# Patient Record
Sex: Female | Born: 1990 | Race: Black or African American | Hispanic: No | Marital: Single
Health system: Midwestern US, Academic
[De-identification: ages and names within clinical notes are randomized; demographics above are authoritative.]

## PROBLEM LIST (undated history)

## (undated) DIAGNOSIS — I10 Essential (primary) hypertension: Secondary | ICD-10-CM

## (undated) DIAGNOSIS — J45909 Unspecified asthma, uncomplicated: Secondary | ICD-10-CM

## (undated) DIAGNOSIS — G932 Benign intracranial hypertension: Secondary | ICD-10-CM

---

## 2014-07-06 DIAGNOSIS — R112 Nausea with vomiting, unspecified: Secondary | ICD-10-CM

## 2014-07-06 MED ORDER — sodium chloride 0.9 % 1,000 mL IV fluid
Freq: Once | INTRAVENOUS | Status: AC
Start: 2014-07-06 — End: 2014-07-07
  Administered 2014-07-07: 04:00:00 via INTRAVENOUS

## 2014-07-06 MED ORDER — ondansetron (ZOFRAN) 4 mg/2 mL injection 4 mg
4 | Freq: Once | INTRAMUSCULAR | Status: AC
Start: 2014-07-06 — End: 2014-07-06
  Administered 2014-07-07: 04:00:00 4 mg via INTRAVENOUS

## 2014-07-06 MED ORDER — ketorolac (TORADOL) injection 15 mg
15 | Freq: Once | INTRAMUSCULAR | Status: AC
Start: 2014-07-06 — End: 2014-07-06
  Administered 2014-07-07: 04:00:00 15 mg via INTRAVENOUS

## 2014-07-06 MED FILL — ONDANSETRON HCL (PF) 4 MG/2 ML INJECTION SOLUTION: 4 4 mg/2 mL | INTRAMUSCULAR | Qty: 2

## 2014-07-06 MED FILL — KETOROLAC 15 MG/ML INJECTION SOLUTION: 15 15 mg/mL | INTRAMUSCULAR | Qty: 1

## 2014-07-06 NOTE — Unmapped (Signed)
[facility] ED Note    07/06/2014    Patient History     HPI: Jennifer Hurst is a 23 y.o. female who presents with Pelvic Pain  .  Patient with diarrhea ongoing for the past few months she said she usually has 2 loose stools a day no black stools no blood from below.  Patient has had some nausea and episodic vomiting over the past 2 weeks as well she has some right flank pain suprapubic pain.  She complains of dysuria no hematuria.  No fevers or chills no body aches.  Patient denies pregnancy she is sexually active only with her and she is here with her partner.  Patient denies vaginal discharge vaginal itching or vaginal complaints.     History reviewed. No pertinent past medical history.    History reviewed. No pertinent past surgical history.     reports that she has been smoking Cigarettes.  She has been smoking about 0.50 packs per day. She does not have any smokeless tobacco history on file. She reports that she does not drink alcohol or use illicit drugs.    Discharge Medication List as of 07/07/2014  2:40 AM          Allergies:   Allergies as of 07/06/2014   ??? (No Known Allergies)       Review of Systems     ROS: See HPI for pertinent positives  All other ROS were negative.    Physical Exam     ED Triage Vitals   Vital Signs Group      Temp 07/06/14 2230 98.5 ??F (36.9 ??C)      Temp Source 07/06/14 2230 Oral      Heart Rate 07/06/14 2230 100      Heart Rate Source 07/06/14 2230 Automatic      Resp 07/06/14 2230 18      SpO2 07/06/14 2230 97 %      BP 07/06/14 2230 147/93 mmHg      BP Location 07/06/14 2230 Left arm      BP Method 07/06/14 2230 Automatic      Patient Position 07/06/14 2230 Sitting   SpO2 07/06/14 2230 97 %   O2 Device --      Filed Vitals:    07/06/14 2230 07/07/14 0017 07/07/14 0213   BP: 147/93 140/89 129/80   Pulse: 100 95 78   Temp: 98.5 ??F (36.9 ??C) 98.4 ??F (36.9 ??C) 98 ??F (36.7 ??C)   TempSrc: Oral Oral Oral   Resp: 18 16 16    Height: 5' 5  (1.651 m)     Weight: 215 lb (97.523 kg)     SpO2: 97% 97% 97%      Temp Readings from Last 2 Encounters:   07/07/14 98 ??F (36.7 ??C) Oral     BP Readings from Last 2 Encounters:   07/07/14 129/80     Pulse Readings from Last 2 Encounters:   07/07/14 78        Constitutional: Generally well appearing, ambulatory without difficulty, no acute distress, non-toxic appearance   Eyes:  Sclera anicteric, conjunctiva normal.  HEENT:  Atraumatic, external ears normal, oropharynx moist.   Neck: normal range of motion, supple.  Respiratory:  No respiratory distress, No excessory muscle use  Cardiovascular:  Regular rate, 2+ pulse = BL radial.  GI:  Soft, nondistended, no rebound, no guarding.  Minimal suprapubic tenderness, minimal right flank tenderness  Musculoskeletal:  No edema, no deformities.   Skin:  No rash or nodules noted.   Neurologic:  Awake and alert.  Moves all four extremities.  Light touch intact.    Psychiatric:  Normal mood.  Behavior appropriate.      Diagnostic Studies     Labs:      Labs Reviewed   CBC - Abnormal; Notable for the following:     Hemoglobin 11.4 (*)     MCH 26.7 (*)     All other components within normal limits   URINALYSIS-MACROSCOPIC W/REFLEX TO MICROSCOPIC - Abnormal; Notable for the following:     Protein, UA 30 mg/dL (*)     Ketones, UA Trace (*)     All other components within normal limits   URINALYSIS, MICROSCOPIC - Abnormal; Notable for the following:     Bacteria, UA Rare (*)     Mucus, UA Present (*)     All other components within normal limits   P24 W/REFLEX TESTED STUDY [facility] ED    Narrative:     HIV-1 p24 Antigen and HIV-1/HIV-2 Antibody not detected.   DIFFERENTIAL   HEPATIC FUNCTION PANEL   LIPASE   HCG URINE, QUALITATIVE   BASIC METABOLIC PANEL       Radiology:  None      EKG interpretation: None performed    Emergency Department Procedures         ED Course and MDM     Jennifer Hurst is a 23 y.o. female who presented to the emergency department with low abdominal pain for the  past month she says is similar to pain she is experienced with ovarian cysts in the past.  Several loose stools a day for the past few weeks although no blood no black stool no recent travel or antibiotics.  Tolerating by mouth without difficulty her lab testing is reassuring.  She has minimal suprapubic pain however no pelvic complaints whatsoever I did discuss at length with her the need to rule out pelvic inflammatory disease with pelvic exam she referred to defer this study at this time was to follow up closely with gynecology.  She is given standard precautions for potential PID including chronic pain, infertility.  Discharge home    Meds given in ED or prescribed for discharge:  Medications   sodium chloride 0.9 % 1,000 mL IV fluid ( Intravenous Stopped 07/07/14 0239)   ondansetron (ZOFRAN) 4 mg/2 mL injection 4 mg (4 mg Intravenous Given 07/06/14 2358)   ketorolac (TORADOL) injection 15 mg (15 mg Intravenous Given 07/06/14 2358)       Clinical Impression:  1. Nausea, vomiting and diarrhea    2. Lower abdominal pain        Patient Referred to:    pcp list      Call the number for the primary care doctor on your insurance card to establish with a primary care doctor as soon as possible for evaluation of your chronic gastointestinal symptoms.    No Pcp  [address]  [place] [address]            Discharge Medications:  Discharge Medication List as of 07/07/2014  2:40 AM      START taking these medications    Details   HYDROcodone-acetaminophen (NORCO) 5-325 mg per tablet Take 1 tablet by mouth every 6 hours as needed for Pain., Starting 07/07/2014, Until Discontinued, Print      ondansetron (ZOFRAN ODT) 4 MG disintegrating tablet Take 1 tablet (4 mg total) by mouth every 6 hours as  needed for Nausea., Starting 07/07/2014, Until Discontinued, Print                   Critical Care Time (Attendings)         Lashelle Koy, MD  07/07/14 [phone]

## 2014-07-06 NOTE — Unmapped (Signed)
Patient ambulated to triage with complaints of nausea, vomiting and pelvic pain for 2 weeks. Pt denies chance of pregnancy.

## 2014-07-06 NOTE — Unmapped (Signed)
Jennifer Hurst     Date of Service: 07/06/2014     Reason for Visit: Pelvic Pain       HPI: Patient is a 23 year old female with PMH of HTN who p/w diarrhea. Patient reports that in January she completed a course of abx for what she describes as food poisoning. Since then she has had two loose stools daily whereas before she had daily well formed bowel movements. She denies blood in stool but says that sometimes her stools are very dark in appearance. She says that one month ago she also began to have abdominal and flank pain. She says her pain is sharp but intermittent bilateral in all quadrants and migratory. She says the pain is usually worst in the suprapubic area and her bilateral flank. She developed nausea three days ago and has vomited once daily for the last three days. Vomit is NB/NB. Her last PO was one hour ago when she ate some chicken. She denies vag discharge/bleeding/discomfort. Her LMP was one week ago. She has regular menstrual cycles. She has no Hx of STI. She is sexually active with women only. She reports that what brought her in today was that she was vomiting and having diarrhea at the same time which she found concerning. She reports dysuria but denies hematuria. [name] in addition to her antibiotic course in January she has taken antibiotic two other times in the last 6 months. She is uncertain of the diagnosis associated with these antibiotic course. She was seen at [facility] in March for these symptoms and was prescribed abx for her GI pain. She also completed a course of abx one month ago for mosquito bites.        History reviewed. No pertinent past medical history.     History reviewed. No pertinent past surgical history.     Jennifer Hurst  reports that she has been smoking Cigarettes.  She has been smoking about 0.50 packs per day. She does not have any smokeless tobacco history on file. She reports that she does not drink alcohol or use illicit drugs.     Previous  Medications    No medications on file        Allergies:   Allergies as of 07/06/2014   ??? (No Known Allergies)        Review of Systems  Gen: Denies fever/chills.   Card: Denies CP, palpitations.   Pulm: Denies SOB, cough.   GI: See HPI  GU: See HPI  Ext: Denies pretibial edema.   Neuro: Denies focal limb weakness/numbness.   Psych: Denies depression/anxiety.      Physical Exam   ED Triage Vitals   Vital Signs Group      Temp 07/06/14 2230 98.5 ??F (36.9 ??C)      Temp Source 07/06/14 2230 Oral      Heart Rate 07/06/14 2230 100      Heart Rate Source 07/06/14 2230 Automatic      Resp 07/06/14 2230 18      SpO2 07/06/14 2230 97 %      BP 07/06/14 2230 147/93 mmHg      BP Location 07/06/14 2230 Left arm      BP Method 07/06/14 2230 Automatic      Patient Position 07/06/14 2230 Sitting   SpO2 07/06/14 2230 97 %   O2 Device --      Gen: Well appearing obese female lying in bed in NAD.  Head: Normocephalic, atraumatic.   Eyes: PERRL, EOMI.   Throat: Oropharynx clear, MMM.   Card: RRR, no M/R/G. No chest wall tenderness.   Pulm: CTAB, no wheezes, rales, or rhonchi.   GI: Soft, non distended. Suprapubic tenderness to palpation. Mild R CVA tenderness. No rebound, some voluntary guarding.  Ext: No pretibial edema.   Neuro: CN grossly intact. Moving all extremities, no focal limb weakness/numbness except as outlined previously in exam. Normal gait. No dysmetria or dysarthria.   Psych: Appropriate mood/affect.        Diagnostic Studies   Labs:   Please see electronic medical record for any tests performed in the ED     Radiology:  No tests were performed during this ED visit       ED Course and MDM     Jennifer Hurst is a 23 y.o. female  with PMH of HTN who p/w diarrhea. Given suprapubic pain and dysuria initial suspicion in this patient was highest for UTI or gastroenteritis given vomiting and diarrhea w/ relatively benign abdominal exam. Patient is well appearing and pyelonephritis seems unlikely despite mild CVA tenderness. For  similar reasons serious GI pathology seems unlikely. STI is unlikely as patient has no GU complaints except for dysuria. Patient declined pelvic exam for further evaluation of possible GU pathology. Patient received routine laboratory workup (LFT, CBC, UA, BMP, pregnancy) which was unremarkable except for trace ketones. Patient received 15mg  toradol for pain control as well as 4mg  Zofran and 1L NS for nausea. Given absence of evidence of UTI it is most likely that patient has gastroenteritis compounded by chronic GI disturbance. Standard discharge precautions were indicated to patient and patient has number for establishing care with PCP to further evaluate GI complaints.    Clinical Impression:  Gastroenteritis     Disposition/Plan: Home in stable condition.    The patient was discussed with my supervising physician who is in agreement with the assessment and plan   unless otherwise noted per their documentation.

## 2014-07-07 ENCOUNTER — Inpatient Hospital Stay
Admit: 2014-07-07 | Discharge: 2014-07-07 | Disposition: A | Discharge status: Home / Self Care | Payer: Medicaid (Managed Care)

## 2014-07-07 LAB — CBC
Hematocrit: 35.5 % (ref 35.0–45.0)
Hemoglobin: 11.4 g/dL (ref 11.7–15.5)
MCH: 26.7 pg (ref 27.0–33.0)
MCHC: 32.1 g/dL (ref 32.0–36.0)
MCV: 83.2 fL (ref 80.0–100.0)
MPV: 8.8 fL (ref 7.5–11.5)
Platelets: 274 10*3/uL (ref 140–400)
RBC: 4.27 10*6/uL (ref 3.80–5.10)
RDW: 13.4 % (ref 11.0–15.0)
WBC: 6.5 10*3/uL (ref 3.8–10.8)

## 2014-07-07 LAB — HEPATIC FUNCTION PANEL
ALT: 11 U/L (ref 7–52)
AST: 15 U/L (ref 13–39)
Albumin: 4.2 g/dL (ref 3.5–5.7)
Alkaline Phosphatase: 56 U/L (ref 36–125)
Bilirubin, Direct: 0 mg/dL (ref 0.0–0.4)
Bilirubin, Indirect: 0.2 mg/dL (ref 0.0–1.1)
Total Bilirubin: 0.2 mg/dL (ref 0.0–1.5)
Total Protein: 7.1 g/dL (ref 6.4–8.9)

## 2014-07-07 LAB — URINALYSIS-MACROSCOPIC W/REFLEX TO MICROSCOPIC
Bilirubin, UA: NEGATIVE
Blood, UA: NEGATIVE
Glucose, UA: NEGATIVE mg/dL
Leukocytes, UA: NEGATIVE
Nitrite, UA: NEGATIVE
Protein, UA: 30 mg/dL
Specific Gravity, UA: 1.025 (ref 1.005–1.035)
Urobilinogen, UA: 0.2 EU/dL (ref 0.2–1.0)
pH, UA: 7 (ref 5.0–8.0)

## 2014-07-07 LAB — URINALYSIS, MICROSCOPIC
RBC, UA: 1 /HPF (ref 0–3)
Squam Epithel, UA: <1 /HPF (ref 0–5)
WBC, UA: 1 /HPF (ref 0–5)

## 2014-07-07 LAB — BASIC METABOLIC PANEL
Anion Gap: 7 mmol/L (ref 3–16)
BUN: 11 mg/dL (ref 7–25)
CO2: 26 mmol/L (ref 21–33)
Calcium: 9.4 mg/dL (ref 8.6–10.3)
Chloride: 104 mmol/L (ref 98–110)
Creatinine: 0.85 mg/dL (ref 0.60–1.30)
GFR MDRD Af Amer: 101 See note.
GFR MDRD Non Af Amer: 84 See note.
Glucose: 85 mg/dL (ref 70–100)
Osmolality, Calculated: 283 mOsm/kg (ref 278–305)
Potassium: 3.8 mmol/L (ref 3.5–5.3)
Sodium: 137 mmol/L (ref 133–146)

## 2014-07-07 LAB — DIFFERENTIAL
Basophils Absolute: 20 /uL (ref 0–200)
Basophils Relative: 0.3 % (ref 0.0–1.0)
Eosinophils Absolute: 234 /uL (ref 15–500)
Eosinophils Relative: 3.6 % (ref 0.0–8.0)
Lymphocytes Absolute: 2028 /uL (ref 850–3900)
Lymphocytes Relative: 31.2 % (ref 15.0–45.0)
Monocytes Absolute: 559 /uL (ref 200–950)
Monocytes Relative: 8.6 % (ref 0.0–12.0)
Neutrophils Absolute: 3660 /uL (ref 1500–7800)
Neutrophils Relative: 56.3 % (ref 40.0–80.0)

## 2014-07-07 LAB — HCG URINE, QUALITATIVE: Preg Test, Ur: NEGATIVE

## 2014-07-07 LAB — LIPASE: Lipase: 19 U/L (ref 4–82)

## 2014-07-07 LAB — P24 W/REFLEX TESTED STUDY UCMC ED: HIV 1+2 AB/AGN: NONREACTIVE

## 2014-07-07 MED ORDER — HYDROcodone-acetaminophen (NORCO) 5-325 mg per tablet
5-325 | ORAL_TABLET | Freq: Four times a day (QID) | ORAL | 0.00 refills | 15.50000 days | Status: AC | PRN
Start: 2014-07-07 — End: ?

## 2014-07-07 MED ORDER — ondansetron (ZOFRAN ODT) 4 MG disintegrating tablet
4 | ORAL_TABLET | Freq: Four times a day (QID) | ORAL | Status: AC | PRN
Start: 2014-07-07 — End: ?

## 2014-07-07 NOTE — Unmapped (Signed)
Diarrhea  Diarrhea is frequent loose and watery bowel movements. It can cause you to feel weak and dehydrated. Dehydration can cause you to become tired and thirsty, have a dry mouth, and have decreased urination that often is dark yellow. Diarrhea is a sign of another problem, most often an infection that will not last long. In most cases, diarrhea typically lasts 2 3 days. However, it can last longer if it is a sign of something more serious. It is important to treat your diarrhea as directed by your caregive to lessen or prevent future episodes of diarrhea.  CAUSES   Some common causes include:  ?? Gastrointestinal infections caused by viruses, bacteria, or parasites.  ?? Food poisoning or food allergies.  ?? Certain medicines, such as antibiotics, chemotherapy, and laxatives.  ?? Artificial sweeteners and fructose.  ?? Digestive disorders.  HOME CARE INSTRUCTIONS  ?? Ensure adequate fluid intake (hydration): have 1 cup (8 oz) of fluid for each diarrhea episode. Avoid fluids that contain simple sugars or sports drinks, fruit juices, whole milk products, and sodas. Your urine should be clear or pale yellow if you are drinking enough fluids. Hydrate with an oral rehydration solution that you can purchase at pharmacies, retail stores, and online. You can prepare an oral rehydration solution at home by mixing the following ingredients together:  ??   tsp table salt.  ?? ?? tsp baking soda.  ??  tsp salt substitute containing potassium chloride.  ?? 1  tablespoons sugar.  ?? 1 L (34 oz) of water.  ?? Certain foods and beverages may increase the speed at which food moves through the gastrointestinal (GI) tract. These foods and beverages should be avoided and include:  ?? Caffeinated and alcoholic beverages.  ?? High-fiber foods, such as raw fruits and vegetables, nuts, seeds, and whole grain breads and cereals.  ?? Foods and beverages sweetened with sugar alcohols, such as xylitol, sorbitol, and mannitol.  ?? Some foods may be well  tolerated and may help thicken stool including:  ?? Starchy foods, such as rice, toast, pasta, low-sugar cereal, oatmeal, grits, baked potatoes, crackers, and bagels.  ?? Bananas.  ?? Applesauce.  ?? Add probiotic-rich foods to help increase healthy bacteria in the GI tract, such as yogurt and fermented milk products.  ?? Wash your hands well after each diarrhea episode.  ?? Only take over-the-counter or prescription medicines as directed by your caregiver.  ?? Take a warm bath to relieve any burning or pain from frequent diarrhea episodes.  SEEK IMMEDIATE MEDICAL CARE IF:   ?? You are unable to keep fluids down.  ?? You have persistent vomiting.  ?? You have blood in your stool, or your stools are black and tarry.  ?? You do not urinate in 6 8 hours, or there is only a small amount of very dark urine.  ?? You have abdominal pain that increases or localizes.  ?? You have weakness, dizziness, confusion, or lightheadedness.  ?? You have a severe headache.  ?? Your diarrhea gets worse or does not get better.  ?? You have a fever or persistent symptoms for more than 2 3 days.  ?? You have a fever and your symptoms suddenly get worse.  MAKE SURE YOU:   ?? Understand these instructions.  ?? Will watch your condition.  ?? Will get help right away if you are not doing well or get worse.  Document Released: 11/15/2002 Document Revised: 11/11/2012 Document Reviewed: 08/02/2012  ExitCare?? Patient Information ??2014 [employer].

## 2014-07-07 NOTE — Unmapped (Signed)
Received order to discharge patient home. Pt verbalized understanding of discharge instructions. Ambulated out of C[facility] with steady gait.

## 2018-03-08 ENCOUNTER — Encounter (HOSPITAL_COMMUNITY): Payer: Self-pay | Admitting: Nurse Practitioner

## 2018-03-08 ENCOUNTER — Emergency Department (HOSPITAL_COMMUNITY)
Admission: EM | Admit: 2018-03-08 | Discharge: 2018-03-09 | Disposition: A | Discharge status: Home / Self Care | Payer: Medicaid Other | Attending: Emergency Medicine | Admitting: Emergency Medicine

## 2018-03-08 ENCOUNTER — Emergency Department (HOSPITAL_COMMUNITY): Payer: Medicaid Other

## 2018-03-08 DIAGNOSIS — I10 Essential (primary) hypertension: Secondary | ICD-10-CM | POA: Insufficient documentation

## 2018-03-08 DIAGNOSIS — J45909 Unspecified asthma, uncomplicated: Secondary | ICD-10-CM | POA: Insufficient documentation

## 2018-03-08 DIAGNOSIS — R1011 Right upper quadrant pain: Secondary | ICD-10-CM

## 2018-03-08 DIAGNOSIS — R05 Cough: Secondary | ICD-10-CM

## 2018-03-08 DIAGNOSIS — Z87891 Personal history of nicotine dependence: Secondary | ICD-10-CM | POA: Diagnosis not present

## 2018-03-08 DIAGNOSIS — R059 Cough, unspecified: Secondary | ICD-10-CM

## 2018-03-08 HISTORY — DX: Unspecified asthma, uncomplicated: J45.909

## 2018-03-08 HISTORY — DX: Essential (primary) hypertension: I10

## 2018-03-08 HISTORY — DX: Benign intracranial hypertension: G93.2

## 2018-03-08 LAB — COMPREHENSIVE METABOLIC PANEL
ALBUMIN: 3.8 g/dL (ref 3.5–5.0)
ALK PHOS: 62 U/L (ref 38–126)
ALT: 12 U/L — ABNORMAL LOW (ref 14–54)
ANION GAP: 6 (ref 5–15)
AST: 15 U/L (ref 15–41)
BILIRUBIN TOTAL: 0.2 mg/dL — AB (ref 0.3–1.2)
BUN: 9 mg/dL (ref 6–20)
CALCIUM: 9.3 mg/dL (ref 8.9–10.3)
CO2: 22 mmol/L (ref 22–32)
Chloride: 107 mmol/L (ref 101–111)
Creatinine, Ser: 0.75 mg/dL (ref 0.44–1.00)
GFR calc Af Amer: >60 mL/min (ref >60)
GFR calc non Af Amer: >60 mL/min (ref >60)
GLUCOSE: 87 mg/dL (ref 65–99)
Potassium: 4.1 mmol/L (ref 3.5–5.1)
SODIUM: 135 mmol/L (ref 135–145)
Total Protein: 7.6 g/dL (ref 6.5–8.1)

## 2018-03-08 LAB — CBC
HEMATOCRIT: 37 % (ref 36.0–46.0)
HEMOGLOBIN: 12 g/dL (ref 12.0–15.0)
MCH: 26.8 pg (ref 26.0–34.0)
MCHC: 32.4 g/dL (ref 30.0–36.0)
MCV: 82.8 fL (ref 78.0–100.0)
Platelets: 287 10*3/uL (ref 150–400)
RBC: 4.47 MIL/uL (ref 3.87–5.11)
RDW: 14 % (ref 11.5–15.5)
WBC: 5.9 10*3/uL (ref 4.0–10.5)

## 2018-03-08 LAB — URINALYSIS, ROUTINE W REFLEX MICROSCOPIC
Bilirubin Urine: NEGATIVE
Glucose, UA: NEGATIVE mg/dL
Hgb urine dipstick: NEGATIVE
Ketones, ur: NEGATIVE mg/dL
Nitrite: NEGATIVE
PROTEIN: NEGATIVE mg/dL
SPECIFIC GRAVITY, URINE: 1.024 (ref 1.005–1.030)
pH: 5 (ref 5.0–8.0)

## 2018-03-08 LAB — LIPASE, BLOOD: Lipase: 25 U/L (ref 11–51)

## 2018-03-08 LAB — I-STAT BETA HCG BLOOD, ED (MC, WL, AP ONLY)

## 2018-03-08 MED ORDER — IBUPROFEN 800 MG PO TABS
800.0000 mg | ORAL_TABLET | Freq: Once | ORAL | Status: AC
  Administered 2018-03-08: 800 mg via ORAL
  Filled 2018-03-08: qty 1

## 2018-03-08 NOTE — ED Triage Notes (Signed)
Pt is c/o a sharp stabbing pain on her RUQ abdomen radiating to lower back, onset 3 days ago, associated with nausea, denies vomiting

## 2018-03-08 NOTE — ED Provider Notes (Signed)
[facility]-EMERGENCY DEPT Provider Note   CSN: [id number] Arrival date & time: 03/08/18  1756     History   Chief Complaint Chief Complaint  Patient presents with  . Abdominal Pain    HPI Michele Weaver is a 27 y.o. female.  HPI  Michele Weaver is a 27 year old female with a history of asthma, hypertension and pseudotumor cerebri who presents to the emergency department for evaluation of right upper quadrant abdominal pain.  Patient reports that she has had intermittent right upper quadrant pain for the past several weeks now, but seems to be worsening over the past 3 days.  Pain feels generally sore all the time, but occasionally becomes sharp and stabbing in nature.  Radiates to the right lower back.  Pain is 8/10 in severity at its worst.  Reports that eating does not seem to trigger her symptoms.  She has tried taking ibuprofen for her symptoms which provides temporary improvement.  Also endorses nausea, denies vomiting.  States that she has also had some mild bilateral lower abdominal cramping as well.  States that she does not have any cramping at this time. Denies fevers, chills, diarrhea, hematochezia, melena, dysuria, urinary frequency, flank pain, vaginal discharge, vaginal bleeding, chest pain, shortness of breath. She denies any previous abdominal surgeries. LMP "about a month ago." She reports she is sexually active with one female partner who is her wife. Has no concern for STD.   Past Medical History:  Diagnosis Date  . Asthma   . Hypertension   . Pseudotumor cerebri     There are no active problems to display for this patient.   History reviewed. No pertinent surgical history.   OB History   None      Home Medications    Prior to Admission medications   Not on File    Family History History reviewed. No pertinent family history.  Social History Social History   Tobacco Use  . Smoking status: Former Games developermoker  . Smokeless tobacco: Never  Used  Substance Use Topics  . Alcohol use: Yes  . Drug use: Not Currently     Allergies   Patient has no known allergies.   Review of Systems Review of Systems  Constitutional: Negative for chills and fever.  Eyes: Negative for visual disturbance.  Respiratory: Negative for shortness of breath.   Cardiovascular: Negative for chest pain.  Gastrointestinal: Positive for abdominal pain (RUQ) and nausea. Negative for constipation, diarrhea and vomiting.  Genitourinary: Negative for difficulty urinating, dysuria, flank pain, frequency, hematuria, vaginal bleeding and vaginal discharge.  Musculoskeletal: Positive for back pain (right sided).  Skin: Negative for rash.  Neurological: Negative for weakness, light-headedness and headaches.  Psychiatric/Behavioral: Negative for agitation.     Physical Exam Updated Vital Signs BP (!) 152/61 (BP Location: Right Arm)   Pulse 95   Temp 98.3 F (36.8 C) (Oral)   Resp 18   LMP 03/01/2018 (Approximate)   SpO2 100%   Physical Exam  Constitutional: She is oriented to person, place, and time. She appears well-developed and well-nourished. No distress.  Sitting at bedside in no apparent distress. Non-toxic appearing.  HENT:  Head: Normocephalic and atraumatic.  Mouth/Throat: Oropharynx is clear and moist. No oropharyngeal exudate.  Mucous membranes moist  Eyes: Pupils are equal, round, and reactive to light. Conjunctivae are normal. Right eye exhibits no discharge. Left eye exhibits no discharge.  Neck: Normal range of motion. Neck supple.  Cardiovascular: Normal rate, regular rhythm and intact distal  pulses.  No murmur heard. Pulmonary/Chest: Effort normal and breath sounds normal. No stridor. No respiratory distress. She has no wheezes. She has no rales.  Abdominal:  Abdomen soft. Acutely tender to palpation in the RUQ with guarding. No rebound tenderness. No tenderness elsewhere on the abdomen. Positive Murphy's sign. Negative  McBurney's point. No CVA tenderness.   Musculoskeletal:  Pt reports pain below right scapula, this is non-tender to palpation. No ecchymosis, erythema or rash overlying the skin.   Neurological: She is alert and oriented to person, place, and time. Coordination normal.  Skin: Skin is warm and dry. Capillary refill takes less than 2 seconds. She is not diaphoretic.  Psychiatric: She has a normal mood and affect. Her behavior is normal.  Nursing note and vitals reviewed.    ED Treatments / Results  Labs (all labs ordered are listed, but only abnormal results are displayed) Labs Reviewed  COMPREHENSIVE METABOLIC PANEL - Abnormal; Notable for the following components:      Result Value   ALT 12 (*)    Total Bilirubin 0.2 (*)    All other components within normal limits  URINALYSIS, ROUTINE W REFLEX MICROSCOPIC - Abnormal; Notable for the following components:   APPearance HAZY (*)    Leukocytes, UA MODERATE (*)    Bacteria, UA RARE (*)    Squamous Epithelial / LPF 0-5 (*)    All other components within normal limits  LIPASE, BLOOD  CBC  I-STAT BETA HCG BLOOD, ED ([facility], [facility], [facility] ONLY)    EKG None  Radiology No results found.  Procedures Procedures (including critical care time)  Medications Ordered in ED Medications  ibuprofen (ADVIL,MOTRIN) tablet 800 mg (800 mg Oral Given 03/08/18 2226)     Initial Impression / Assessment and Plan / ED Course  I have reviewed the triage vital signs and the nursing notes.  Pertinent labs & imaging results that were available during my care of the patient were reviewed by me and considered in my medical decision making (see chart for details).     Presents with intermittent RUQ pain, worsening for the past few days. Has associated nausea. Also reports some mild bilateral lower abdominal cramping at times, but denies this now. Denies fever, chills, urinary symptoms, vaginal discharge.   On exam she is afebrile and non-toxic. VSS. She is  acutely tender to palpation in the RUQ with guarding. Has a positive Murphy's sign. No tenderness in the lower abdomen. Patient declines pelvic exam.   Lab work reviewed. CBC and CMP unremarkable. Lipase negative. UA without evidence of infection. BhCG negative. Concern for cholelithiasis. Plan to get RUQ abdominal US for further evaluation. Given presentation and lab work do not suspect acute appendicitis, cholecystitis, pancreatitis, bowel obstruction, perforated viscus, diverticulitis, nephrolithiasis, ectopic pregnancy, ovarian torsion. No new vaginal discharge to suggest PID, although patient refuses pelvic exam to check for CMT/adnexal tenderness.   Patient given ibuprofen for pain, reports she does not want any stronger pain medication.  Sign out at shift change given to oncoming PA Ivar Drape for disposition following return of RUQ ultrasound. If cholelithiasis she will need general surgery follow up. If negative feel that patient can go home without further imaging and treat pain with ibuprofen at home with strict return precautions. Discussed this with patient who agrees with above plan pending [place] result.   Final Clinical Impressions(s) / ED Diagnoses   Final diagnoses:  RUQ abdominal pain    ED Discharge Orders    None  Kellie Shropshire, PA-C 03/08/18 2340    Charlynne Pander, MD 03/08/18 [phone]

## 2018-03-09 MED ORDER — BENZONATATE 100 MG PO CAPS: 200.0000 mg | ORAL_CAPSULE | Freq: Two times a day (BID) | ORAL | 0 refills | Status: DC | PRN

## 2018-03-09 MED ORDER — OMEPRAZOLE 20 MG PO CPDR: 20.0000 mg | DELAYED_RELEASE_CAPSULE | Freq: Every day | ORAL | 0 refills | Status: DC

## 2018-03-09 NOTE — Discharge Instructions (Signed)
It is unclear what is causing your pain.  Your ultrasound and blood work look normal. Return to the ER if your symptoms worsen.  Try taking the cough medicine and omeprazole.  If you don't get any relief with these medications, please follow-up with your Primary Care Doctor.

## 2018-03-09 NOTE — ED Provider Notes (Signed)
US is unremarkable.  Has had dry cough x 2 days.  No fever or hypoxia.  Doubt PNA or PE. Will try tessalon for cough.  Will also give omeprazole.  PCP follow-up.   Roxy HorsemanBrowning, Abubakar Crispo, PA-C 03/09/18 [id number]    Charlynne PanderYao, David Hsienta, MD 03/09/18 [phone]

## 2018-06-08 ENCOUNTER — Ambulatory Visit: Payer: Medicaid Other | Admitting: Family Medicine

## 2018-08-02 IMAGING — US US ABDOMEN LIMITED
1 series · 14 of 25 positions shown · non-contrast
Comparison: None.

CLINICAL DATA: 26-year-old female with right upper quadrant
abdominal pain.

EXAM:
ULTRASOUND ABDOMEN LIMITED RIGHT UPPER QUADRANT

[Series 1: us abdomen limited · 14 of 51 slices shown]
[im 1/51]
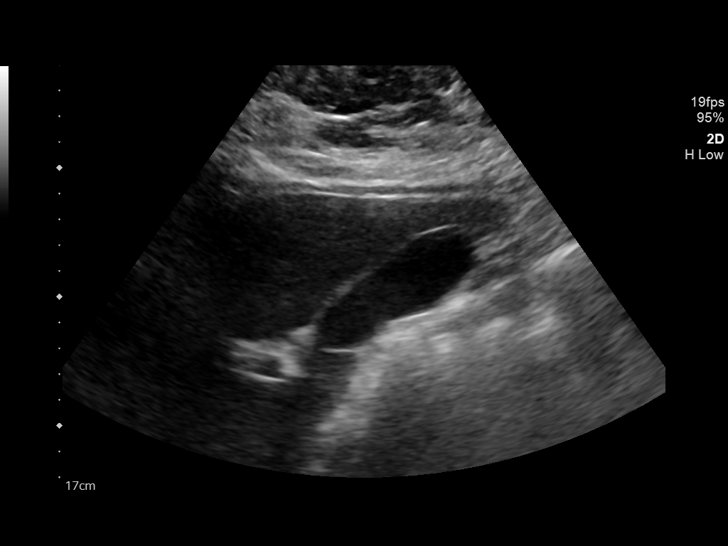
[im 5/51]
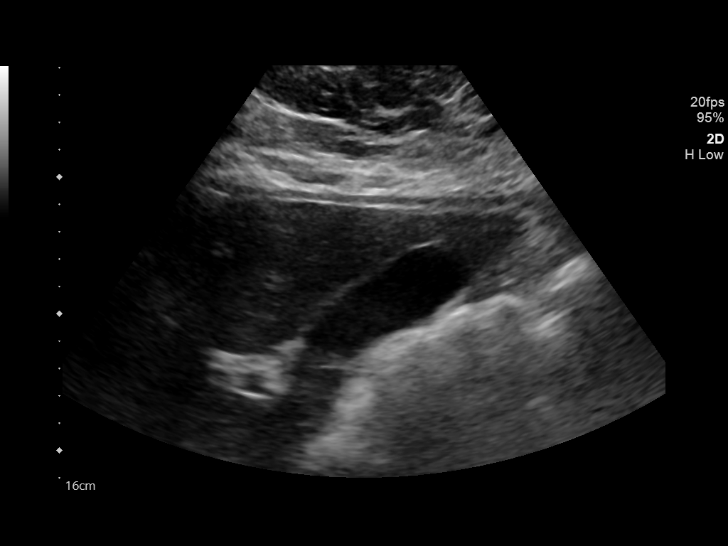
[im 9/51]
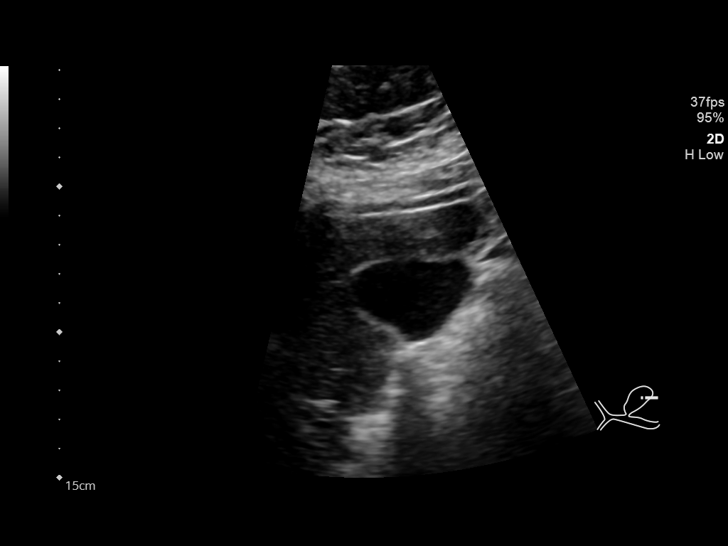
[im 13/51]
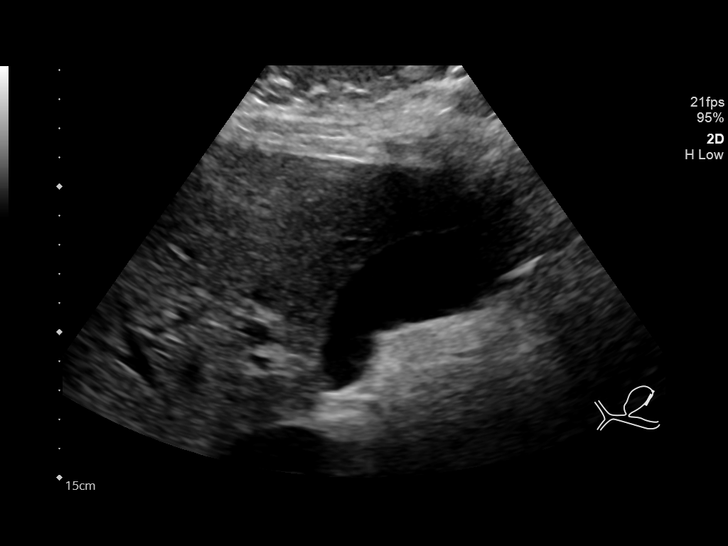
[im 17/51]
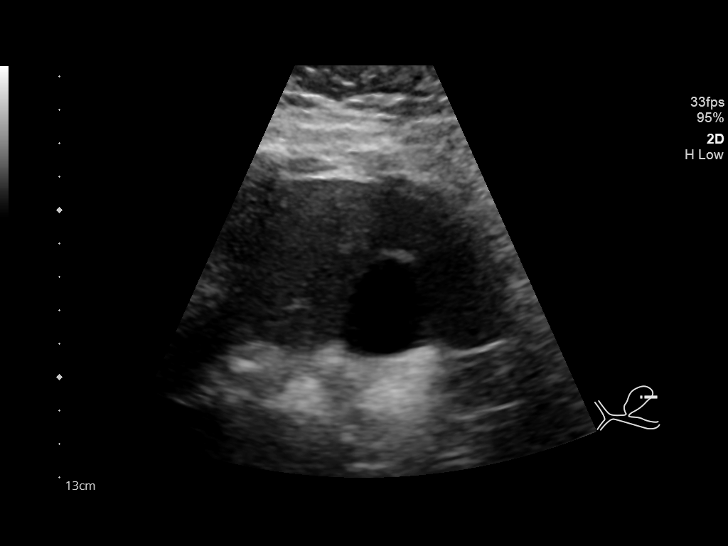
[im 19/51]
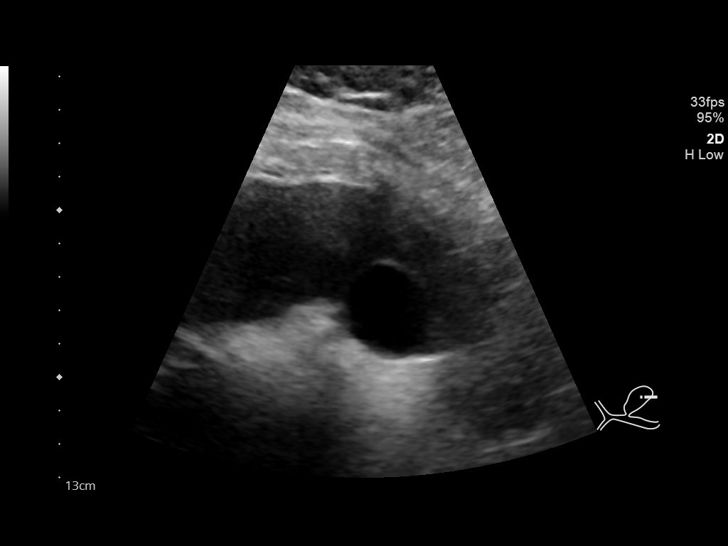
[im 23/51]
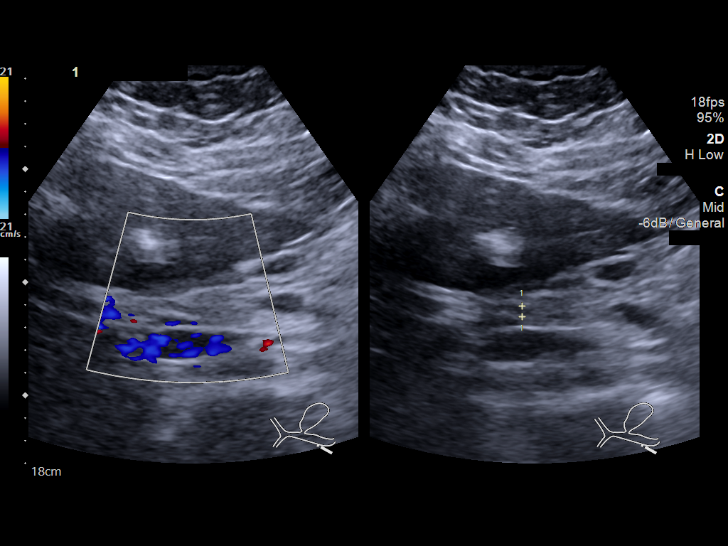
[im 28/51]
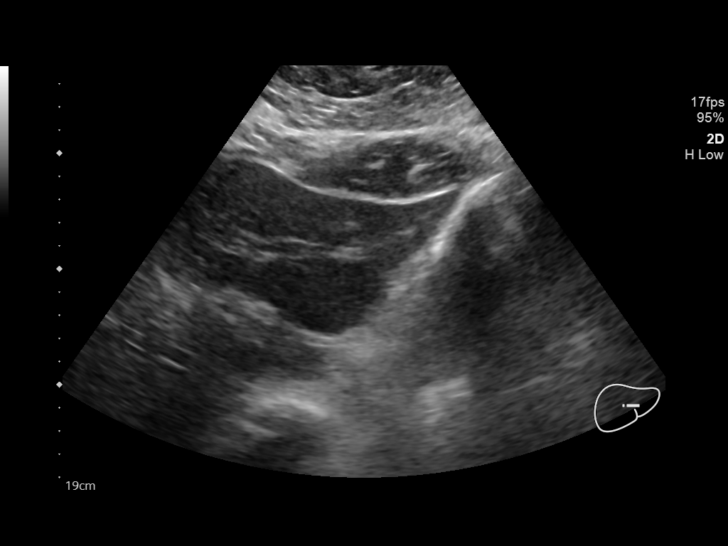
[im 32/51]
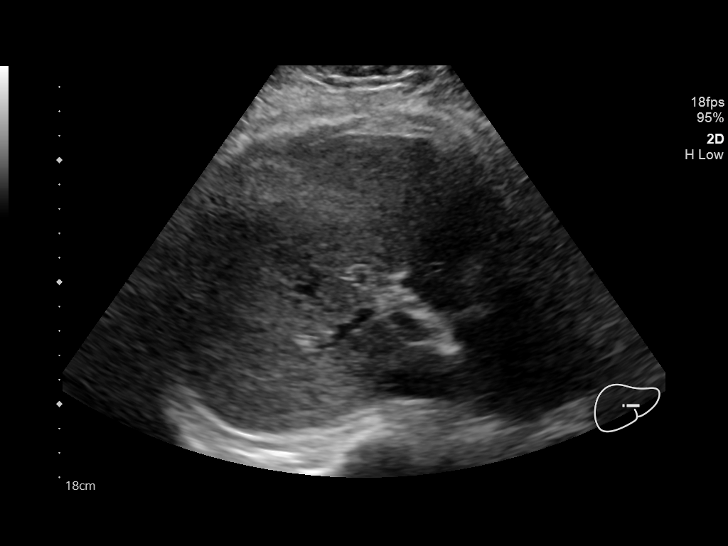
[im 34/51]
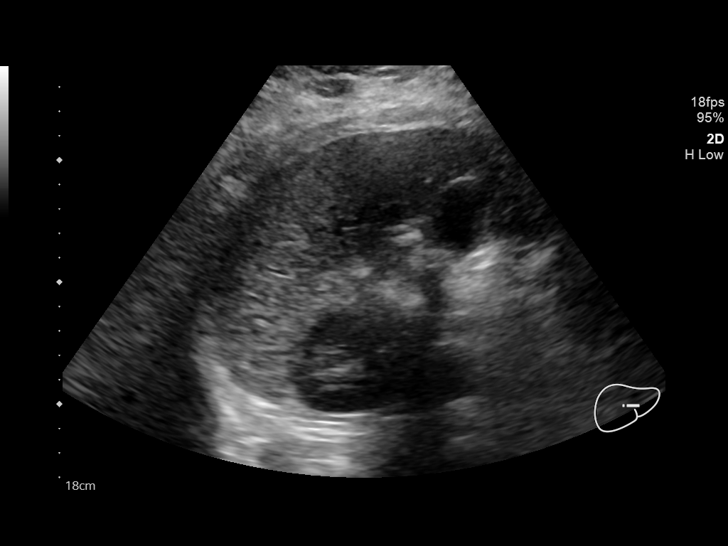
[im 38/51]
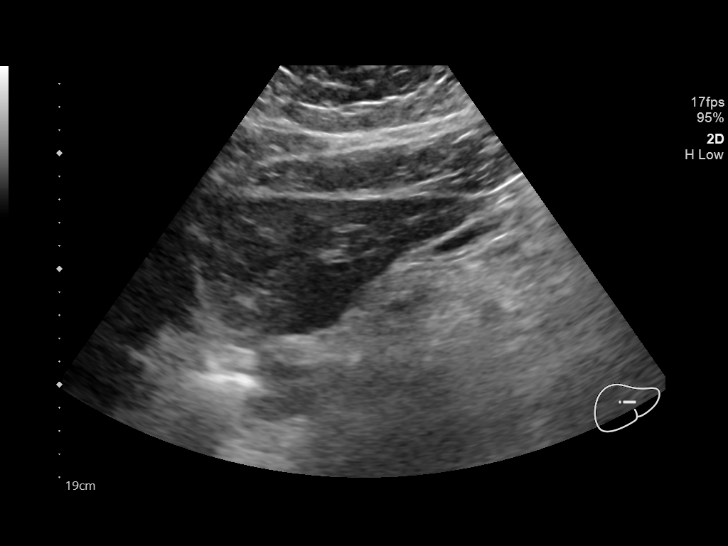
[im 42/51]
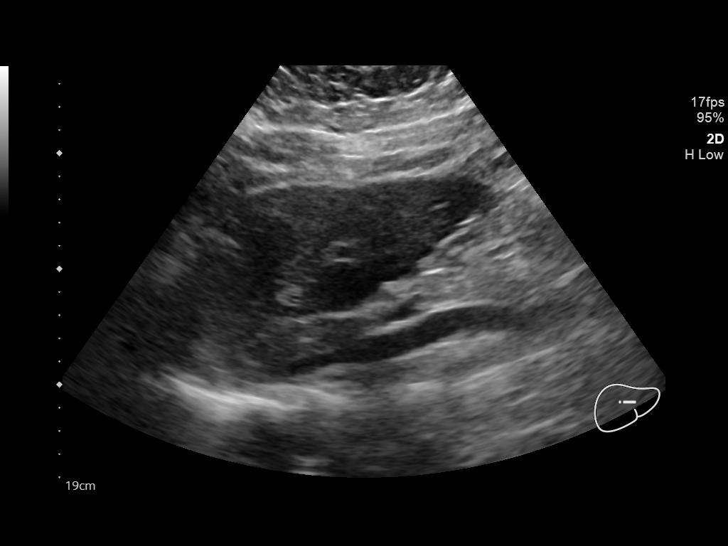
[im 46/51]
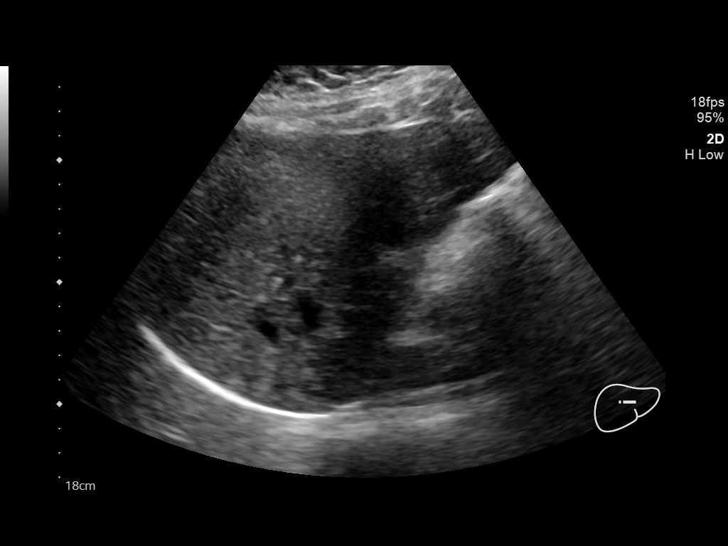
[im 51/51]
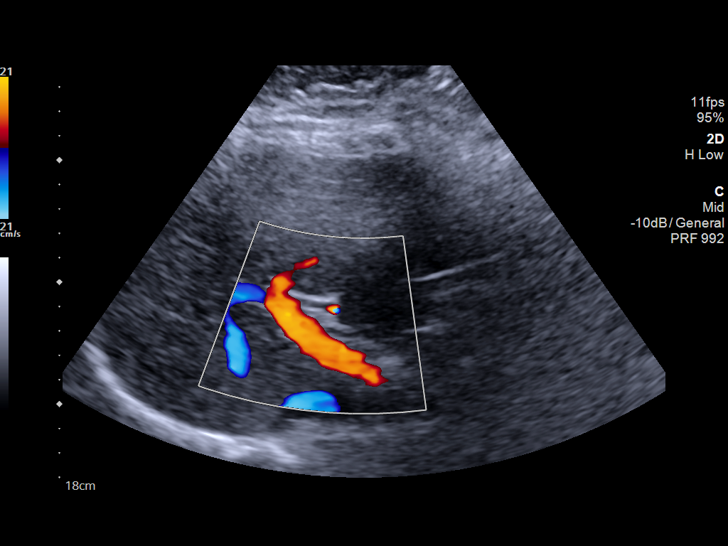

[14 of 25 positions shown; findings below may reference images not displayed]

FINDINGS: Gallbladder:

No gallstones or wall thickening visualized. No sonographic Murphy
sign noted by sonographer.

Common bile duct:

Diameter: 5 mm

Liver:

The liver is unremarkable. Portal vein is patent on color Doppler
imaging with normal direction of blood flow towards the liver.
IMPRESSION: Unremarkable right upper quadrant ultrasound.

## 2018-08-15 ENCOUNTER — Encounter (HOSPITAL_COMMUNITY): Payer: Self-pay | Admitting: Emergency Medicine

## 2018-08-15 ENCOUNTER — Other Ambulatory Visit: Payer: Self-pay

## 2018-08-15 ENCOUNTER — Emergency Department (HOSPITAL_COMMUNITY)
Admission: EM | Admit: 2018-08-15 | Discharge: 2018-08-15 | Disposition: A | Discharge status: Home / Self Care | Payer: Medicaid Other | Attending: Emergency Medicine | Admitting: Emergency Medicine

## 2018-08-15 ENCOUNTER — Emergency Department (HOSPITAL_COMMUNITY): Payer: Medicaid Other

## 2018-08-15 DIAGNOSIS — R0789 Other chest pain: Secondary | ICD-10-CM | POA: Insufficient documentation

## 2018-08-15 DIAGNOSIS — J9801 Acute bronchospasm: Secondary | ICD-10-CM | POA: Diagnosis not present

## 2018-08-15 DIAGNOSIS — I10 Essential (primary) hypertension: Secondary | ICD-10-CM | POA: Insufficient documentation

## 2018-08-15 DIAGNOSIS — Z87891 Personal history of nicotine dependence: Secondary | ICD-10-CM | POA: Diagnosis not present

## 2018-08-15 DIAGNOSIS — D649 Anemia, unspecified: Secondary | ICD-10-CM | POA: Diagnosis not present

## 2018-08-15 LAB — CBC
HEMATOCRIT: 35.8 % — AB (ref 36.0–46.0)
Hemoglobin: 11 g/dL — ABNORMAL LOW (ref 12.0–15.0)
MCH: 26.6 pg (ref 26.0–34.0)
MCHC: 30.7 g/dL (ref 30.0–36.0)
MCV: 86.7 fL (ref 78.0–100.0)
Platelets: 298 10*3/uL (ref 150–400)
RBC: 4.13 MIL/uL (ref 3.87–5.11)
RDW: 13.5 % (ref 11.5–15.5)
WBC: 9.5 10*3/uL (ref 4.0–10.5)

## 2018-08-15 LAB — BASIC METABOLIC PANEL
Anion gap: 11 (ref 5–15)
BUN: 7 mg/dL (ref 6–20)
CHLORIDE: 103 mmol/L (ref 98–111)
CO2: 24 mmol/L (ref 22–32)
Calcium: 9.1 mg/dL (ref 8.9–10.3)
Creatinine, Ser: 0.92 mg/dL (ref 0.44–1.00)
GFR calc non Af Amer: >60 mL/min (ref >60)
GLUCOSE: 101 mg/dL — AB (ref 70–99)
POTASSIUM: 3.7 mmol/L (ref 3.5–5.1)
Sodium: 138 mmol/L (ref 135–145)

## 2018-08-15 LAB — I-STAT BETA HCG BLOOD, ED (MC, WL, AP ONLY): I-stat hCG, quantitative: <5 m[IU]/mL (ref <5)

## 2018-08-15 LAB — I-STAT TROPONIN, ED: Troponin i, poc: 0.02 ng/mL (ref 0.00–0.08)

## 2018-08-15 MED ORDER — IBUPROFEN 800 MG PO TABS
800.0000 mg | ORAL_TABLET | Freq: Once | ORAL | Status: AC
  Administered 2018-08-15: 800 mg via ORAL
  Filled 2018-08-15: qty 1

## 2018-08-15 MED ORDER — NAPROXEN 500 MG PO TABS: 500.0000 mg | ORAL_TABLET | Freq: Two times a day (BID) | ORAL | 0 refills | Status: AC

## 2018-08-15 MED ORDER — PREDNISONE 50 MG PO TABS: 50.0000 mg | ORAL_TABLET | Freq: Every day | ORAL | 0 refills | Status: AC

## 2018-08-15 MED ORDER — ALBUTEROL SULFATE HFA 108 (90 BASE) MCG/ACT IN AERS: 2.0000 | INHALATION_SPRAY | RESPIRATORY_TRACT | 0 refills | Status: AC | PRN

## 2018-08-15 MED ORDER — PREDNISONE 20 MG PO TABS
60.0000 mg | ORAL_TABLET | Freq: Once | ORAL | Status: AC
  Administered 2018-08-15: 60 mg via ORAL
  Filled 2018-08-15: qty 3

## 2018-08-15 MED ORDER — IPRATROPIUM-ALBUTEROL 0.5-2.5 (3) MG/3ML IN SOLN
3.0000 mL | Freq: Once | RESPIRATORY_TRACT | Status: AC
  Administered 2018-08-15: 3 mL via RESPIRATORY_TRACT
  Filled 2018-08-15: qty 3

## 2018-08-15 NOTE — Discharge Instructions (Addendum)
Take acetaminophen as needed for additional pain relief.  Return if symptoms are getting worse.  Do not resume smoking.

## 2018-08-15 NOTE — ED Provider Notes (Signed)
[facility] EMERGENCY DEPARTMENT Provider Note   CSN: [id number] Arrival date & time: 08/15/18  0135     History   Chief Complaint Chief Complaint  Patient presents with  . Chest Pain  . Shortness of Breath    HPI Michele Weaver is a 27 y.o. female.  The history is provided by the patient.  Chest Pain   Associated symptoms include shortness of breath.  Shortness of Breath  Associated symptoms include chest pain.  She has history of asthma, hypertension, pseudotumor cerebri and comes in with chest discomfort over the last month.  She describes a midsternal pain that is worse with deep breathing, worse with laying down, worse with exertion.  Over the last week, she is also had pain in the right side of her chest which seems to radiate toward the back.  This is also worse with deep breathing, laying flat, exertion.  She rates her pain at 8/10.  There is an intermittent cough.  She denies nausea, vomiting, diaphoresis.  She denies fever or chills.  She stopped smoking 1 month ago.  She states there is a new cat in the house and wonders if that might be part of the problem.  There has been no treatment at home.  Past Medical History:  Diagnosis Date  . Asthma   . Hypertension   . Pseudotumor cerebri     There are no active problems to display for this patient.   History reviewed. No pertinent surgical history.   OB History   None      Home Medications    Prior to Admission medications   Medication Sig Start Date End Date Taking? Authorizing Provider  acetaZOLAMIDE (DIAMOX) 250 MG tablet Take 1,000 mg by mouth 2 (two) times daily.    Yes [provider]  benzonatate (TESSALON) 100 MG capsule Take 2 capsules (200 mg total) by mouth 2 (two) times daily as needed for cough. Patient not taking: Reported on 08/15/2018 03/09/18   Roxy Horseman, PA-C  omeprazole (PRILOSEC) 20 MG capsule Take 1 capsule (20 mg total) by mouth daily. Patient not taking:  Reported on 08/15/2018 03/09/18   Roxy Horseman, PA-C    Family History No family history on file.  Social History Social History   Tobacco Use  . Smoking status: Former Games developer  . Smokeless tobacco: Never Used  Substance Use Topics  . Alcohol use: Yes  . Drug use: Not Currently     Allergies   Patient has no known allergies.   Review of Systems Review of Systems  Respiratory: Positive for shortness of breath.   Cardiovascular: Positive for chest pain.  All other systems reviewed and are negative.    Physical Exam Updated Vital Signs BP 134/82   Pulse 71   Temp 98.2 F (36.8 C) (Oral)   Resp (!) 26   Ht 5\' 5"  (1.651 m)   Wt 131.5 kg   LMP 07/23/2018   SpO2 100%   BMI 48.26 kg/m   Physical Exam  Nursing note and vitals reviewed.  27 year old female, resting comfortably and in no acute distress. Vital signs are significant for elevated respiratory rate. Oxygen saturation is 100%, which is normal. Head is normocephalic and atraumatic. PERRLA, EOMI. Oropharynx is clear. Neck is nontender and supple without adenopathy or JVD. Back is nontender and there is no CVA tenderness. Lungs are clear without rales, wheezes, or rhonchi.  Long exhalation phase is noted and slight wheezing is noted with forced  exhalation. Chest is nontender. Heart has regular rate and rhythm without murmur. Abdomen is soft, flat, nontender without masses or hepatosplenomegaly and peristalsis is normoactive. Extremities have no cyanosis or edema, full range of motion is present. Skin is warm and dry without rash. Neurologic: Mental status is normal, cranial nerves are intact, there are no motor or sensory deficits.  ED Treatments / Results  Labs (all labs ordered are listed, but only abnormal results are displayed) Labs Reviewed  BASIC METABOLIC PANEL - Abnormal; Notable for the following components:      Result Value   Glucose, Bld 101 (*)    All other components within normal limits    CBC - Abnormal; Notable for the following components:   Hemoglobin 11.0 (*)    HCT 35.8 (*)    All other components within normal limits  I-STAT TROPONIN, ED  I-STAT BETA HCG BLOOD, ED ([facility], [facility], [facility] ONLY)    EKG EKG Interpretation  Date/Time:  Saturday August 15 2018 01:42:43 EDT Ventricular Rate:  88 PR Interval:  148 QRS Duration: 76 QT Interval:  362 QTC Calculation: 438 R Axis:   47 Text Interpretation:  Normal sinus rhythm Normal ECG No old tracing to compare Confirmed by Dione Booze ([id number]) on 08/15/2018 1:48:00 AM   Radiology Dg Chest 2 View  Result Date: 08/15/2018 CLINICAL DATA:  Aching pain behind the right breast for 1 month. Pain on inhalation after obtaining a CT. Shortness of breath. History of hypertension. EXAM: CHEST - 2 VIEW COMPARISON:  None. FINDINGS: The heart size and mediastinal contours are within normal limits. Both lungs are clear. The visualized skeletal structures are unremarkable. IMPRESSION: No active cardiopulmonary disease. Electronically Signed   By: Burman Nieves M.D.   On: 08/15/2018 02:23    Procedures Procedures (including critical care time)  Medications Ordered in ED Medications  ibuprofen (ADVIL,MOTRIN) tablet 800 mg (has no administration in time range)  predniSONE (DELTASONE) tablet 60 mg (has no administration in time range)  ipratropium-albuterol (DUONEB) 0.5-2.5 (3) MG/3ML nebulizer solution 3 mL (3 mLs Nebulization Given 08/15/18 6045)     Initial Impression / Assessment and Plan / ED Course  I have reviewed the triage vital signs and the nursing notes.  Pertinent labs & imaging results that were available during my care of the patient were reviewed by me and considered in my medical decision making (see chart for details).  Chest discomfort which I suspect is secondary to bronchospasm.  She does show soft signs of bronchospasm on physical exam.  Old records are reviewed, and she was seen in the ED with abdominal pain and  cough in March of this year.  Chest x-ray shows no evidence of pneumonia.  Labs are significant only for mild anemia.  He will be given a therapeutic trial of albuterol with ipratropium.  7:13 AM Following above-noted treatment, she stated that her chest did not feel as tight, but pain had not changed.  On reexam, there is definitely improved airflow and no longer any prolonged exhalation phase.  She is given a prednisone and ibuprofen, discharged with prescriptions for albuterol inhaler, prednisone, naproxen.  Encouraged to find a PCP with whom to follow-up.  Return precautions discussed.  Final Clinical Impressions(s) / ED Diagnoses   Final diagnoses:  Atypical chest pain  Bronchospasm  Normochromic normocytic anemia    ED Discharge Orders         Ordered    predniSONE (DELTASONE) 50 MG tablet  Daily  08/15/18 0710    albuterol (PROVENTIL HFA;VENTOLIN HFA) 108 (90 Base) MCG/ACT inhaler  Every 4 hours PRN     08/15/18 0710    naproxen (NAPROSYN) 500 MG tablet  2 times daily     08/15/18 0710           Dione Booze, MD 08/15/18 [phone]

## 2018-08-15 NOTE — ED Triage Notes (Signed)
C/o aching pain behind R breast x 1 month.  Pt got a cat and since then has now had pain in center of chest when she inhales that radiates to her back with sob x 1 week.  Reports history of asthma and allergies and believes the cat is causing her SOB.

## 2018-08-25 ENCOUNTER — Encounter: Payer: Self-pay | Admitting: Neurology

## 2018-10-20 NOTE — Progress Notes (Deleted)
NEUROLOGY CONSULTATION NOTE  Michele Weaver MRN: [id number] DOB: 01-Mar-1991  Referring provider: Wynell Balloon, MD Primary care provider: ***  Reason for consult: Pseudotumor cerebri  HISTORY OF PRESENT ILLNESS: Michele Weaver is a 27 year old ***-handed female with hypertension, asthma, and idiopathic intracranial hypertension who presents to establish care for idiopathic intracranial hypertension.  History supplemented by referring providers note.  She was diagnosed with idiopathic intracranial hypertension ***  MRI of the brain and orbits with and without contrast from 05/15/2018 revealed prominent optic nerve sheaths bilaterally and no acute intracranial abnormality.  MRV of the brain showed moderate to advanced stenosis bilaterally at the junction of the transverse and sigmoid sinuses but no evidence of venous sinus thrombosis.  She is currently taking acetazolamide 500mg  twice daily.  Her ophthalmologist is Dr. Wynell Balloon at [facility].  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  08/15/18 LABS:  CBC with WBC 9.5, HGB 11, HCT 35.8, PLT 298; BMP with Na 138, K 3.7, Cl 103, CO2 24, glucose 101, BUN 7, Cr 0.92  PAST MEDICAL HISTORY: Past Medical History:  Diagnosis Date  . Asthma   . Hypertension   . Pseudotumor cerebri     PAST SURGICAL HISTORY: No past surgical history on file.  MEDICATIONS: Current Outpatient Medications on File Prior to Visit  Medication Sig Dispense Refill  . acetaZOLAMIDE (DIAMOX) 250 MG tablet Take 1,000 mg by mouth 2 (two) times daily.     Marland Kitchen albuterol (PROVENTIL HFA;VENTOLIN HFA) 108 (90 Base) MCG/ACT inhaler Inhale 2 puffs into the lungs every 4 (four) hours as needed for wheezing or shortness of breath (or coughing). 1 Inhaler 0  . naproxen (NAPROSYN) 500 MG tablet Take 1 tablet (500 mg total) by mouth 2 (two) times daily. 30 tablet 0  . predniSONE (DELTASONE) 50 MG tablet Take 1 tablet (50 mg total) by mouth daily. 5  tablet 0   No current facility-administered medications on file prior to visit.     ALLERGIES: No known drug allergies  FAMILY HISTORY: No family history on file. ***.  SOCIAL HISTORY: Social History   Socioeconomic History  . Marital status: Married    Spouse name: Not on file  . Number of children: Not on file  . Years of education: Not on file  . Highest education level: Not on file  Occupational History  . Not on file  Social Needs  . Financial resource strain: Not on file  . Food insecurity:    Worry: Not on file    Inability: Not on file  . Transportation needs:    Medical: Not on file    Non-medical: Not on file  Tobacco Use  . Smoking status: Former Games developer  . Smokeless tobacco: Never Used  Substance and Sexual Activity  . Alcohol use: Yes  . Drug use: Not Currently  . Sexual activity: Yes  Lifestyle  . Physical activity:    Days per week: Not on file    Minutes per session: Not on file  . Stress: Not on file  Relationships  . Social connections:    Talks on phone: Not on file    Gets together: Not on file    Attends religious service: Not on file    Active member of club or organization: Not on file    Attends meetings of clubs or organizations: Not on file    Relationship status: Not on file  . Intimate partner violence:    Fear of current or ex  partner: Not on file    Emotionally abused: Not on file    Physically abused: Not on file    Forced sexual activity: Not on file  Other Topics Concern  . Not on file  Social History Narrative  . Not on file    REVIEW OF SYSTEMS: Constitutional: No fevers, chills, or sweats, no generalized fatigue, change in appetite Eyes: No visual changes, double vision, eye pain Ear, nose and throat: No hearing loss, ear pain, nasal congestion, sore throat Cardiovascular: No chest pain, palpitations Respiratory:  No shortness of breath at rest or with exertion, wheezes GastrointestinaI: No nausea, vomiting,  diarrhea, abdominal pain, fecal incontinence Genitourinary:  No dysuria, urinary retention or frequency Musculoskeletal:  No neck pain, back pain Integumentary: No rash, pruritus, skin lesions Neurological: as above Psychiatric: No depression, insomnia, anxiety Endocrine: No palpitations, fatigue, diaphoresis, mood swings, change in appetite, change in weight, increased thirst Hematologic/Lymphatic:  No purpura, petechiae. Allergic/Immunologic: no itchy/runny eyes, nasal congestion, recent allergic reactions, rashes  PHYSICAL EXAM: *** General: No acute distress.  Patient appears ***-groomed.  *** Head:  Normocephalic/atraumatic Eyes:  fundi examined but not visualized Neck: supple, no paraspinal tenderness, full range of motion Back: No paraspinal tenderness Heart: regular rate and rhythm Lungs: Clear to auscultation bilaterally. Vascular: No carotid bruits. Neurological Exam: Mental status: alert and oriented to person, place, and time, recent and remote memory intact, fund of knowledge intact, attention and concentration intact, speech fluent and not dysarthric, language intact. Cranial nerves: CN I: not tested CN II: pupils equal, round and reactive to light, visual fields intact CN III, IV, VI:  full range of motion, no nystagmus, no ptosis CN V: facial sensation intact CN VII: upper and lower face symmetric CN VIII: hearing intact CN IX, X: gag intact, uvula midline CN XI: sternocleidomastoid and trapezius muscles intact CN XII: tongue midline Bulk & Tone: normal, no fasciculations. Motor:  5/5 throughout *** Sensation:  Pinprick *** temperature *** and vibration sensation intact.  ***. Deep Tendon Reflexes:  2+ throughout, *** toes downgoing.  *** Finger to nose testing:  Without dysmetria.  *** Heel to shin:  Without dysmetria.  *** Gait:  Normal station and stride.  Able to turn and tandem walk. Romberg ***.  IMPRESSION: Idiopathic intracranial  hypertension  PLAN: ***  Thank you for allowing me to take part in the care of this patient.  Shon MilletAdam Jaffe, DO  CC:  Wynell Balloonhristopher Weaver, MD

## 2018-10-21 ENCOUNTER — Ambulatory Visit: Payer: Medicaid Other | Admitting: Neurology

## 2019-02-01 ENCOUNTER — Ambulatory Visit: Payer: Medicaid Other | Admitting: Neurology

## 2019-02-01 ENCOUNTER — Encounter: Payer: Self-pay | Admitting: Neurology

## 2019-02-01 NOTE — Progress Notes (Deleted)
NEUROLOGY CONSULTATION NOTE  Michele Weaver MRN: [id number] DOB: 07-19-1991  Referring provider: Wynell Balloon, MD Primary care provider: No PCP  Reason for consult:  Idiopathic intracranial hypertension  HISTORY OF PRESENT ILLNESS: Michele Weaver is a 28 year old***-handed African-American woman who presents for idiopathic intracranial hypertension.  History supplemented by referring provider note.  She was diagnosed with idiopathic intracranial hypertension in 2018.  ***.  She is taking acetazolamide 500mg  twice daily.  She was last evaluated by ophthalmologist Dr. Wynell Balloon in September 2019 who noted improving bilateral papilledema.      Report of MRI of brain and orbits with and without contrast from 05/15/18 (imaging not available) demonstrated "prominent optic nerve sheaths bilaterally", otherwise normal MRI of brain and orbits.  MRV of brain with and without contrast demonstrated "moderate to advanced stenosis bilaterally at the junction of the transverse and sigmoid sinuses" but no evidence of venous sinus thrombosis.  08/15/18 LABS:  CBC with WBC 9.5, HGB 11, HCT 35.8, PLT 298; BMP with Na 138, K 3.7, Cl 103, CO2 24, glucose 101, BUN 7, Cr 0.92.  PAST MEDICAL HISTORY: Past Medical History:  Diagnosis Date  . Asthma   . Hypertension   . Pseudotumor cerebri     PAST SURGICAL HISTORY: No past surgical history on file.  MEDICATIONS: Current Outpatient Medications on File Prior to Visit  Medication Sig Dispense Refill  . acetaZOLAMIDE (DIAMOX) 250 MG tablet Take 1,000 mg by mouth 2 (two) times daily.     Marland Kitchen albuterol (PROVENTIL HFA;VENTOLIN HFA) 108 (90 Base) MCG/ACT inhaler Inhale 2 puffs into the lungs every 4 (four) hours as needed for wheezing or shortness of breath (or coughing). 1 Inhaler 0  . naproxen (NAPROSYN) 500 MG tablet Take 1 tablet (500 mg total) by mouth 2 (two) times daily. 30 tablet 0  . predniSONE (DELTASONE) 50 MG tablet Take 1 tablet (50 mg total)  by mouth daily. 5 tablet 0   No current facility-administered medications on file prior to visit.     ALLERGIES: No Known Allergies  FAMILY HISTORY: No family history on file. ***.  SOCIAL HISTORY: Social History   Socioeconomic History  . Marital status: Married    Spouse name: Not on file  . Number of children: Not on file  . Years of education: Not on file  . Highest education level: Not on file  Occupational History  . Not on file  Social Needs  . Financial resource strain: Not on file  . Food insecurity:    Worry: Not on file    Inability: Not on file  . Transportation needs:    Medical: Not on file    Non-medical: Not on file  Tobacco Use  . Smoking status: Former Games developer  . Smokeless tobacco: Never Used  Substance and Sexual Activity  . Alcohol use: Yes  . Drug use: Not Currently  . Sexual activity: Yes  Lifestyle  . Physical activity:    Days per week: Not on file    Minutes per session: Not on file  . Stress: Not on file  Relationships  . Social connections:    Talks on phone: Not on file    Gets together: Not on file    Attends religious service: Not on file    Active member of club or organization: Not on file    Attends meetings of clubs or organizations: Not on file    Relationship status: Not on file  . Intimate partner violence:    Fear  of current or ex partner: Not on file    Emotionally abused: Not on file    Physically abused: Not on file    Forced sexual activity: Not on file  Other Topics Concern  . Not on file  Social History Narrative  . Not on file    REVIEW OF SYSTEMS: Constitutional: No fevers, chills, or sweats, no generalized fatigue, change in appetite Eyes: No visual changes, double vision, eye pain Ear, nose and throat: No hearing loss, ear pain, nasal congestion, sore throat Cardiovascular: No chest pain, palpitations Respiratory:  No shortness of breath at rest or with exertion, wheezes GastrointestinaI: No nausea,  vomiting, diarrhea, abdominal pain, fecal incontinence Genitourinary:  No dysuria, urinary retention or frequency Musculoskeletal:  No neck pain, back pain Integumentary: No rash, pruritus, skin lesions Neurological: as above Psychiatric: No depression, insomnia, anxiety Endocrine: No palpitations, fatigue, diaphoresis, mood swings, change in appetite, change in weight, increased thirst Hematologic/Lymphatic:  No purpura, petechiae. Allergic/Immunologic: no itchy/runny eyes, nasal congestion, recent allergic reactions, rashes  PHYSICAL EXAM: *** General: No acute distress.  Patient appears ***-groomed.  *** Head:  Normocephalic/atraumatic Eyes:  fundi examined but not visualized Neck: supple, no paraspinal tenderness, full range of motion Back: No paraspinal tenderness Heart: regular rate and rhythm Lungs: Clear to auscultation bilaterally. Vascular: No carotid bruits. Neurological Exam: Mental status: alert and oriented to person, place, and time, recent and remote memory intact, fund of knowledge intact, attention and concentration intact, speech fluent and not dysarthric, language intact. Cranial nerves: CN I: not tested CN II: pupils equal, round and reactive to light, visual fields intact CN III, IV, VI:  full range of motion, no nystagmus, no ptosis CN V: facial sensation intact CN VII: upper and lower face symmetric CN VIII: hearing intact CN IX, X: gag intact, uvula midline CN XI: sternocleidomastoid and trapezius muscles intact CN XII: tongue midline Bulk & Tone: normal, no fasciculations. Motor:  5/5 throughout *** Sensation:  Pinprick *** temperature *** and vibration sensation intact.  ***. Deep Tendon Reflexes:  2+ throughout, *** toes downgoing.  *** Finger to nose testing:  Without dysmetria.  *** Heel to shin:  Without dysmetria.  *** Gait:  Normal station and stride.  Able to turn and tandem walk. Romberg ***.  IMPRESSION: ***  PLAN: ***  Thank you for  allowing me to take part in the care of this patient.  Shon Millet, DO  CC: ***
